# Patient Record
Sex: Male | Born: 1945 | Race: Black or African American | Hispanic: No | Marital: Married | Smoking: Former smoker
Health system: Southern US, Community
[De-identification: ages and names within clinical notes are randomized; demographics above are authoritative.]

## PROBLEM LIST (undated history)

## (undated) DIAGNOSIS — I6529 Occlusion and stenosis of unspecified carotid artery: Secondary | ICD-10-CM

## (undated) DIAGNOSIS — I10 Essential (primary) hypertension: Secondary | ICD-10-CM

## (undated) DIAGNOSIS — E785 Hyperlipidemia, unspecified: Secondary | ICD-10-CM

## (undated) DIAGNOSIS — E119 Type 2 diabetes mellitus without complications: Secondary | ICD-10-CM

## (undated) DIAGNOSIS — G459 Transient cerebral ischemic attack, unspecified: Secondary | ICD-10-CM

## (undated) HISTORY — DX: Occlusion and stenosis of unspecified carotid artery: I65.29

## (undated) HISTORY — DX: Hyperlipidemia, unspecified: E78.5

## (undated) HISTORY — DX: Transient cerebral ischemic attack, unspecified: G45.9

## (undated) HISTORY — DX: Essential (primary) hypertension: I10

## (undated) HISTORY — DX: Type 2 diabetes mellitus without complications: E11.9

## (undated) HISTORY — PX: OTHER SURGICAL HISTORY: SHX169

---

## 2005-11-22 HISTORY — PX: COLONOSCOPY: SHX174

## 2013-05-12 DIAGNOSIS — R209 Unspecified disturbances of skin sensation: Secondary | ICD-10-CM

## 2013-05-14 ENCOUNTER — Other Ambulatory Visit (HOSPITAL_COMMUNITY): Payer: Self-pay | Admitting: Internal Medicine

## 2013-05-14 DIAGNOSIS — G459 Transient cerebral ischemic attack, unspecified: Secondary | ICD-10-CM

## 2013-05-16 ENCOUNTER — Ambulatory Visit (HOSPITAL_COMMUNITY)
Admission: RE | Admit: 2013-05-16 | Discharge: 2013-05-16 | Disposition: A | Discharge status: Home / Self Care | Payer: BC Managed Care – PPO | Source: Ambulatory Visit | Attending: Internal Medicine | Admitting: Internal Medicine

## 2013-05-16 DIAGNOSIS — E119 Type 2 diabetes mellitus without complications: Secondary | ICD-10-CM | POA: Insufficient documentation

## 2013-05-16 DIAGNOSIS — I10 Essential (primary) hypertension: Secondary | ICD-10-CM | POA: Insufficient documentation

## 2013-05-16 DIAGNOSIS — G459 Transient cerebral ischemic attack, unspecified: Secondary | ICD-10-CM | POA: Insufficient documentation

## 2014-02-15 ENCOUNTER — Encounter: Payer: Self-pay | Admitting: *Deleted

## 2014-02-15 DIAGNOSIS — I10 Essential (primary) hypertension: Secondary | ICD-10-CM

## 2014-02-15 DIAGNOSIS — E119 Type 2 diabetes mellitus without complications: Secondary | ICD-10-CM

## 2014-02-15 DIAGNOSIS — E785 Hyperlipidemia, unspecified: Secondary | ICD-10-CM | POA: Insufficient documentation

## 2014-02-18 ENCOUNTER — Ambulatory Visit (INDEPENDENT_AMBULATORY_CARE_PROVIDER_SITE_OTHER): Payer: Medicare Other | Admitting: Internal Medicine

## 2014-02-18 ENCOUNTER — Encounter: Payer: Self-pay | Admitting: Internal Medicine

## 2014-02-18 VITALS — BP 140/90 | HR 83 | Ht 70.0 in | Wt 168.0 lb

## 2014-02-18 DIAGNOSIS — I10 Essential (primary) hypertension: Secondary | ICD-10-CM

## 2014-02-18 DIAGNOSIS — R079 Chest pain, unspecified: Secondary | ICD-10-CM

## 2014-02-18 NOTE — Progress Notes (Signed)
HPI Patient is a 68 yo who presents to cardiology by referral from Hugh[facility]oss[facility] He has a history of DM and HTN  On treatment for about 5 years. [place] [facility] [place] stress test about [place] years prior [place] area.  This past fall he [facility] with numbness/weakness in L arm  Dx'd with TIA  Symptoms resolved  Testing done at [facility] (MRI, TTE)  Kept on same meds.   No symptoms since  Has intermitt L shoulder pain.  Not associated with activitiy.  Some L sided abdomenal pain. Has some SOB with activity.  Not consistent. Does have some palpitations, not that frequent  WIll get weak with these  No syncope  Otherwise stays active.  Allergies  Allergen Reactions  . Amoxicillin     Current Outpatient Prescriptions  Medication Sig Dispense Refill  . amLODipine-benazepril (LOTREL) 10-40 MG per capsule Take 1 capsule by mouth daily.      Marland Kitchen. atorvastatin (LIPITOR) 40 MG tablet Take 40 mg by mouth daily.      . metFORMIN (GLUCOPHAGE) 500 MG tablet Take by mouth 2 (two) times daily with a meal.       No current facility-administered medications for this visit.    No past medical history on file.  No past surgical history on file.  No family history on file.  History   Social History  . Marital Status: Married    Spouse Name: N/A    Number of Children: N/A  . Years of Education: N/A   Occupational History  . Not on file.   Social History Main Topics  . Smoking status: Not on file  . Smokeless tobacco: Not on file  . Alcohol Use: Not on file  . Drug Use: Not on file  . Sexual Activity: Not on file   Other Topics Concern  . Not on file   Social History Narrative  . No narrative on file    Review of Systems:  All systems reviewed.  They are negative to the above problem except as previously stated.  Vital Signs: BP 140/90  Pulse 83  Ht 5\' 10"  (1.778 m)  Wt 168 lb (76.204 kg)  BMI 24.11 kg/m2  SpO2 99%  Physical Exam  HEENT:  Normocephalic,  atraumatic. EOMI, PERRLA.  Neck: JVP is normal.  No bruits.  Lungs: clear to auscultation. No rales no wheezes.  Heart: Regular rate and rhythm. Normal S1, S2. No S3.   No significant murmurs. PMI not displaced.  Abdomen:  Supple, nontender. Normal bowel sounds. No masses. No hepatomegaly.  Extremities:   Good distal pulses throughout. No lower extremity edema.  Musculoskeletal :moving all extremities.  Neuro:   alert and oriented x3.  CN II-XII grossly intact.  EKG:  SR 80  Sl ST depression II, III, AVF, V5, V6. Assessment and Plan:  1.  Shoulder pain.  I am not convinced cardiac   He does give some dyspnea on exertion  With reported CV disease and abnormal EKG would set up for stress myoview  2.  HTN  Adequate control  3.  TIA  Need to get records of testing from [facility] (ECHO, MRI)  Patient gives hx susp for afib  WIll discuss with EP  Re looper. Will need long term follow up of carotid Dz.  Review first.  4.  HL  Keep on statin  Good control

## 2014-02-18 NOTE — Patient Instructions (Addendum)
Your physician recommends that you schedule a follow-up appointment in: in year [facility]    Your physician has requested that you have en exercise stress myoview. For further information please visit [email or web address]. Please follow instruction sheet, as given.    Your physician recommends that you continue on your current medications as directed. Please refer to the Current Medication list given to you today.

## 2014-02-20 ENCOUNTER — Encounter (HOSPITAL_COMMUNITY): Payer: Self-pay

## 2014-02-20 ENCOUNTER — Encounter (HOSPITAL_COMMUNITY)
Admission: RE | Admit: 2014-02-20 | Discharge: 2014-02-20 | Disposition: A | Discharge status: Home / Self Care | Payer: Medicare Other | Source: Ambulatory Visit | Attending: Internal Medicine | Admitting: Internal Medicine

## 2014-02-20 DIAGNOSIS — R079 Chest pain, unspecified: Secondary | ICD-10-CM

## 2014-02-20 MED ORDER — TECHNETIUM TC 99M SESTAMIBI GENERIC - CARDIOLITE
30.0000 | Freq: Once | INTRAVENOUS | Status: AC | PRN
  Administered 2014-02-20: 30 via INTRAVENOUS

## 2014-02-20 MED ORDER — REGADENOSON 0.4 MG/5ML IV SOLN
INTRAVENOUS | Status: AC
  Filled 2014-02-20: qty 5

## 2014-02-20 MED ORDER — TECHNETIUM TC 99M SESTAMIBI - CARDIOLITE
10.0000 | Freq: Once | INTRAVENOUS | Status: AC | PRN
  Administered 2014-02-20: 07:00:00 10 via INTRAVENOUS

## 2014-02-20 MED ORDER — SODIUM CHLORIDE 0.9 % IJ SOLN
INTRAMUSCULAR | Status: AC
  Administered 2014-02-20: 10 mL via INTRAVENOUS
  Filled 2014-02-20: qty 10

## 2014-02-20 NOTE — Progress Notes (Signed)
Stress Lab Nurses Notes - Benjamin Kelly  Benjamin Kelly 02/20/2014 Reason for doing test: Chest Pain and HTN Type of test: Stress Cardiolite Nurse performing test: Benjamin PoissonPhyllis Billingsly, RN Nuclear Medicine Tech: Benjamin Kelly Echo Tech: Not Applicable MD performing test: Branch/K.Lawrence NP Family MD: Benjamin Kelly Test explained and consent signed: yes IV started: 22g jelco, Saline lock flushed, No redness or edema and Saline lock started in radiology Symptoms: SOB Treatment/Intervention: None Reason test stopped: reached target HR After recovery IV was: Discontinued via X-ray tech and No redness or edema Patient to return to Nuc. Med at : 9:30 Patient discharged: Home Patient's Condition upon discharge was: stable Comments: During test peak BP 201/102 & HR 171.  Recovery BP 152/101 & HR 100.  Symptoms resolved in recovery. Benjamin Kelly, Benjamin Kelly

## 2014-02-25 ENCOUNTER — Telehealth: Payer: Self-pay | Admitting: Internal Medicine

## 2014-02-25 NOTE — Telephone Encounter (Signed)
New message ° °Patient would like results of stress test, please call and advise.  °

## 2014-02-25 NOTE — Telephone Encounter (Signed)
Follow up    Pt is calling again for stress test results. OK to leave on vm.

## 2014-02-25 NOTE — Telephone Encounter (Signed)
Notified of stress test results. 

## 2014-06-03 IMAGING — US US CAROTID DUPLEX BILAT
1 series · 13 of 24 positions shown · non-contrast
Comparison: None

CLINICAL DATA: TIA, sudden onset left arm numbness, history
hypertension, diabetes, hypercholesterolemia

BILATERAL CAROTID DUPLEX ULTRASOUND
TECHNIQUE: Gray scale imaging, color Doppler and duplex ultrasound
was performed of bilateral carotid and vertebral arteries in the
neck.

[Series 1: us carotid duplex bilat · 0.06mm/px · 13 of 68 slices shown]
[im 1/68]
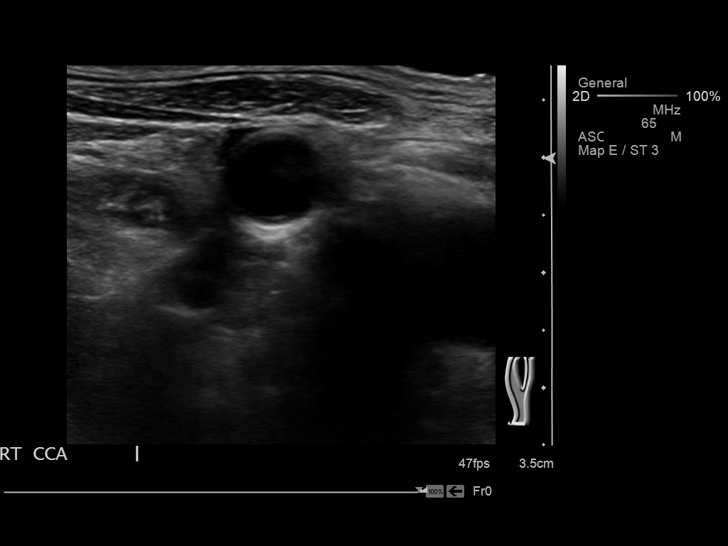
[im 6/68]
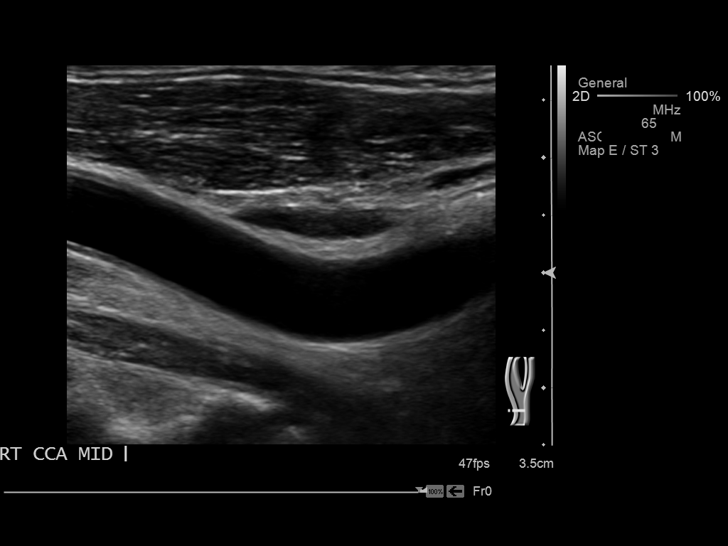
[im 12/68]
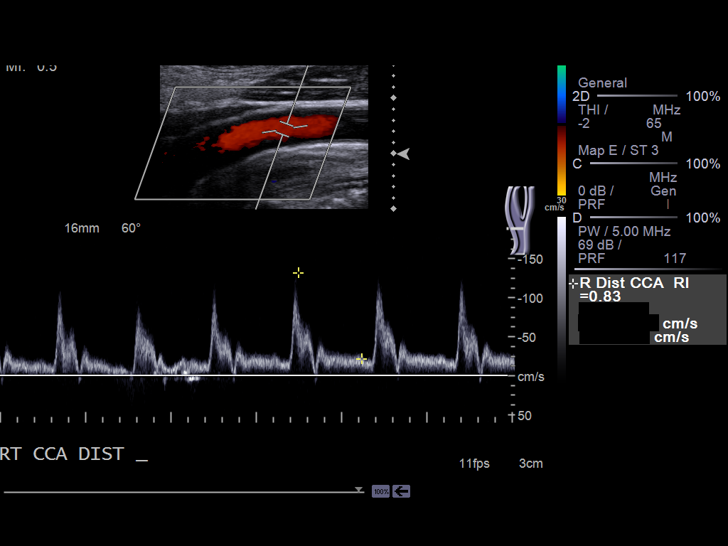
[im 18/68]
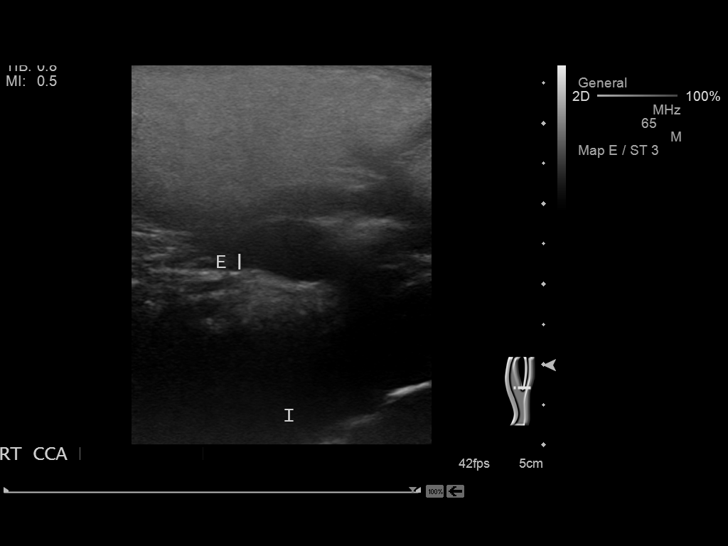
[im 24/68]
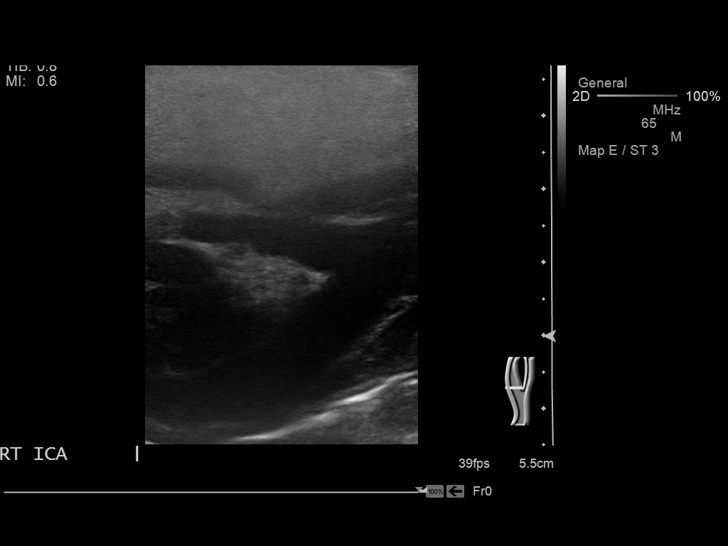
[im 30/68]
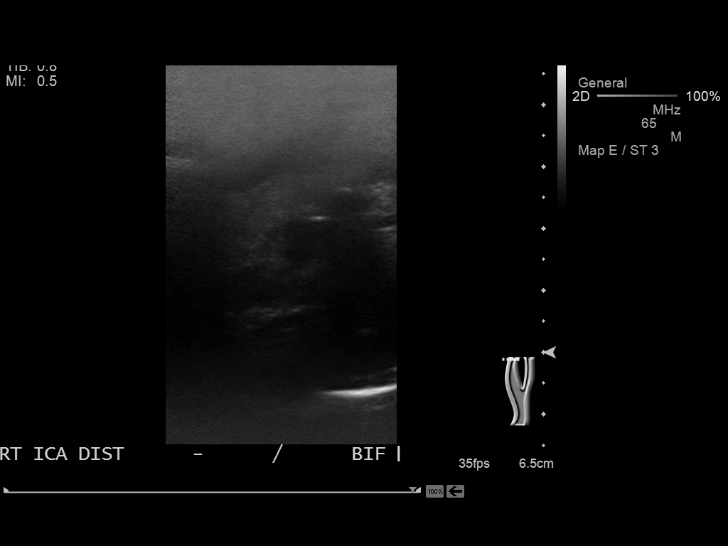
[im 35/68]
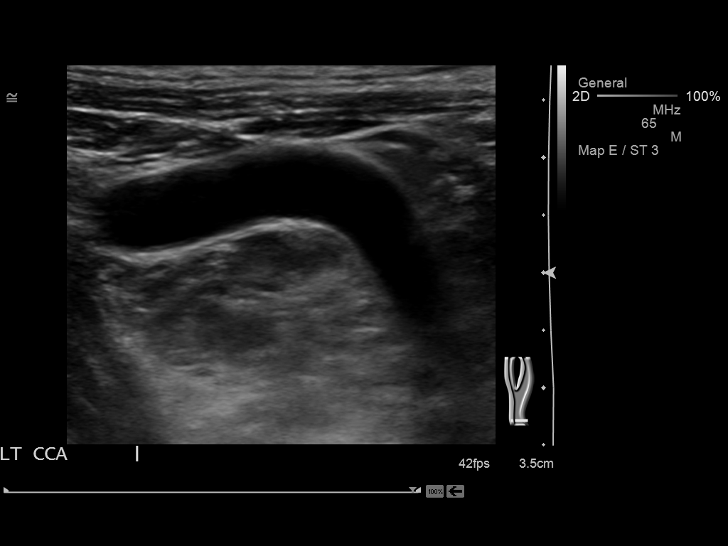
[im 38/68]
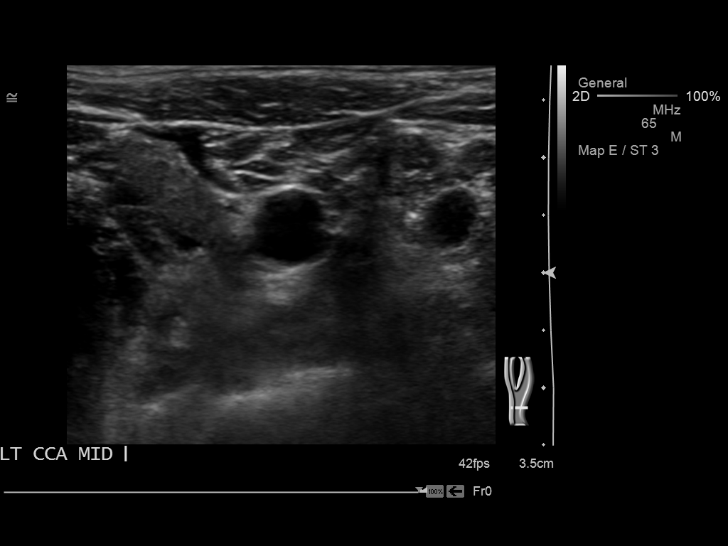
[im 44/68]
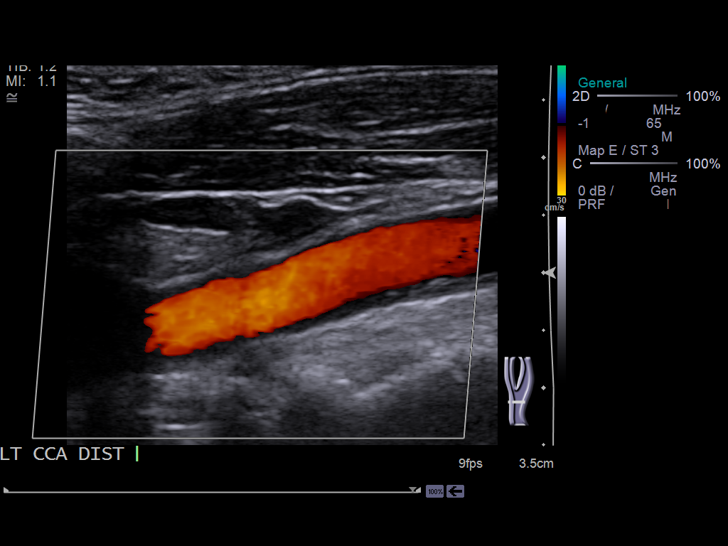
[im 50/68]
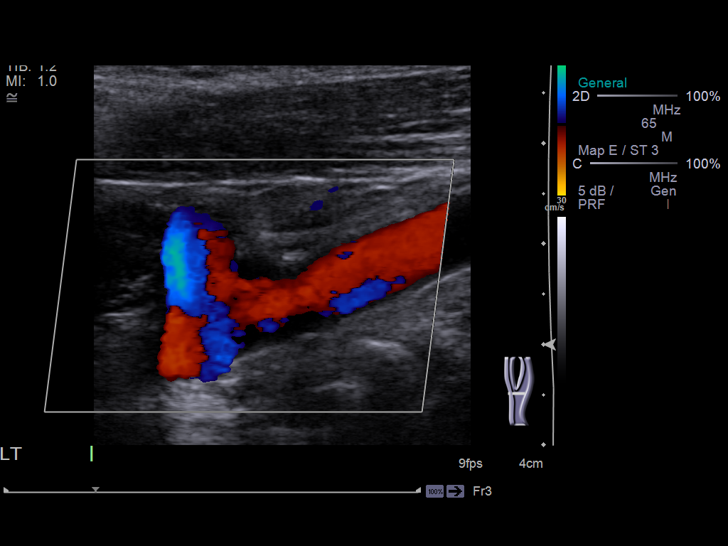
[im 56/68]
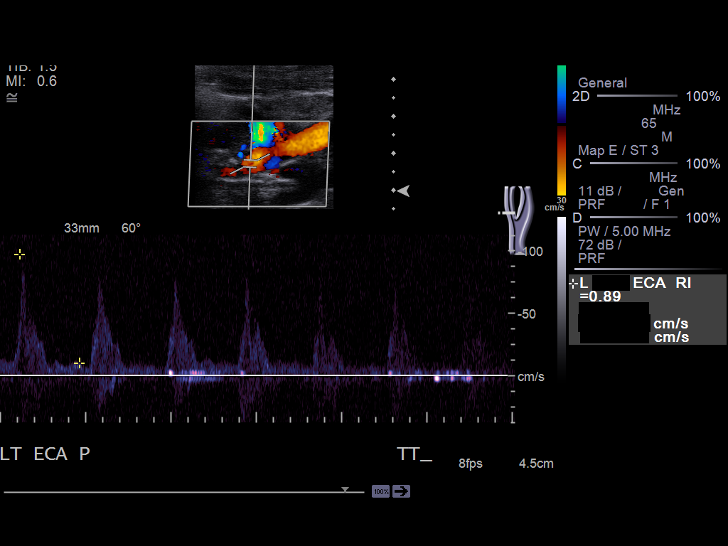
[im 62/68]
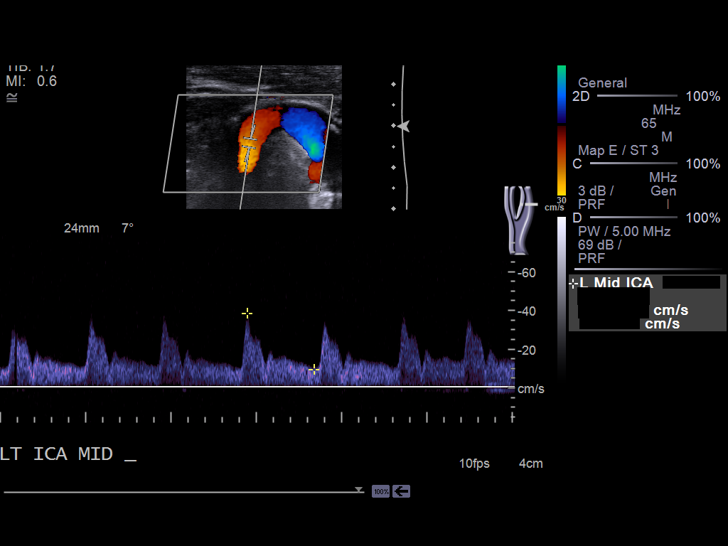
[im 68/68]
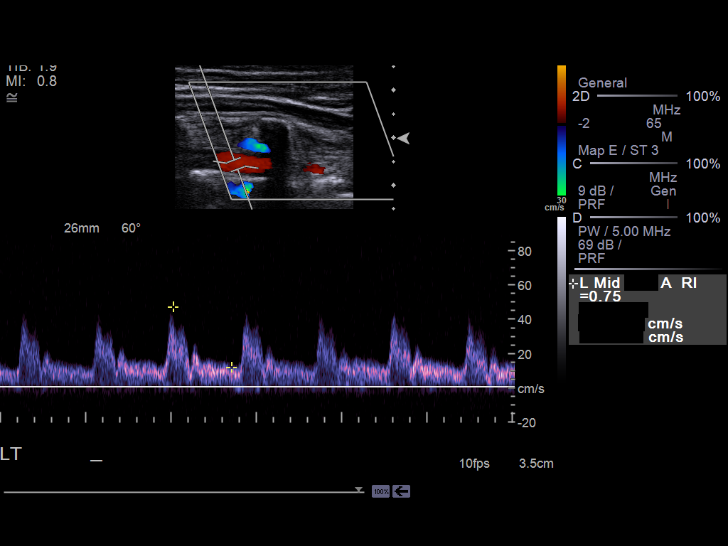

[13 of 24 positions shown; findings below may reference images not displayed]

Criteria:  Quantification of carotid stenosis is based on velocity
parameters that correlate the residual internal carotid diameter
with NASCET-based stenosis levels, using the diameter of the distal
internal carotid lumen as the denominator for stenosis measurement.

The following velocity measurements were obtained:

                 PEAK SYSTOLIC/END DIASTOLIC
RIGHT
ICA:                        51/16cm/sec
CCA:                        140/22cm/sec
SYSTOLIC ICA/CCA RATIO:
DIASTOLIC ICA/CCA RATIO:
ECA:                        60cm/sec

LEFT
ICA:                        64/10cm/sec
CCA:                        125/18cm/sec
SYSTOLIC ICA/CCA RATIO:
DIASTOLIC ICA/CCA RATIO:
ECA:                        97cm/sec
FINDINGS: RIGHT CAROTID ARTERY: Distal right ICA is high and deep,
inadequately visualized.  Tortuous right carotid system.  Mild
scattered intimal thickening.  No significant plaque formation.
Laminar flow by color Doppler imaging.  Wave forms unremarkable.
No high velocity jets.

RIGHT VERTEBRAL ARTERY:  Patent, antegrade

LEFT CAROTID ARTERY: Intimal thickening left carotid system, which
appears tortuous.  Small calcified nonshadowing plaque at distal
left carotid bulb into proximal left ICA.  Turbulent blood flow on
color Doppler imaging.  Spectral broadening left ICA on waveform
analysis.  No high velocity jets.

LEFT VERTEBRAL ARTERY:  Patent, antegrade
IMPRESSION: Mild plaque formation at distal left carotid bulb and proximal left
ICA with velocities corresponding to a less than 50% diameter
stenosis.
No evidence of hemodynamically significant stenosis.
Tortuous carotid systems bilaterally.

## 2014-06-05 ENCOUNTER — Other Ambulatory Visit: Payer: Self-pay | Admitting: *Deleted

## 2014-06-05 DIAGNOSIS — G459 Transient cerebral ischemic attack, unspecified: Secondary | ICD-10-CM

## 2014-06-18 ENCOUNTER — Ambulatory Visit (INDEPENDENT_AMBULATORY_CARE_PROVIDER_SITE_OTHER): Payer: Medicare Other | Admitting: Neurology

## 2014-06-18 ENCOUNTER — Encounter: Payer: Self-pay | Admitting: Neurology

## 2014-06-18 VITALS — BP 137/86 | HR 71 | Ht 68.0 in | Wt 167.0 lb

## 2014-06-18 DIAGNOSIS — G451 Carotid artery syndrome (hemispheric): Secondary | ICD-10-CM

## 2014-06-18 DIAGNOSIS — G458 Other transient cerebral ischemic attacks and related syndromes: Secondary | ICD-10-CM

## 2014-06-18 DIAGNOSIS — E119 Type 2 diabetes mellitus without complications: Secondary | ICD-10-CM

## 2014-06-18 DIAGNOSIS — E785 Hyperlipidemia, unspecified: Secondary | ICD-10-CM

## 2014-06-18 DIAGNOSIS — I10 Essential (primary) hypertension: Secondary | ICD-10-CM

## 2014-06-18 NOTE — Patient Instructions (Addendum)
1. ASA 81mg  daily 2. Continue other home medication include lipitor, ,metformin, amlodipine 3. Monitor weight daily and record.  4. Follow up with PCP for stroke risk factor modification 5. Continue to monitor carotid doppler with PCP. 6. Follow up in 6 months. 7. Will fax a copy to Dr. Margo AyeHall.

## 2014-06-19 NOTE — Progress Notes (Signed)
NEUROLOGY CLINIC NEW PATIENT NOTE  NAME: Benjamin Kelly DOB: 08/07/1946  I saw Benjamin Kelly as a new patient in the neurovascular clinic today regarding  Chief Complaint  Patient presents with  . Cerebrovascular Accident    NP#1  .  HPI: Benjamin Kelly is a 68 y.o. male PMH of HTN, DM, HLD who presents as new patient due to left arm weakness for 30 min one year ago. He is not sure he came her today for but he thinks that he was here for his episode one year ago. Last year in June one day, he woke up with left arm weakness, not able to hold up well, but not dropping things in hand, lasted about 30min and resolved. When he got in ER, symptoms were gone. He denies any numbness, any leg involvement, any headache, vision changes, facial droop or slurry speech. He was told to follow up with PCP and neurology but has not seen neurology since then. He has not had any recurrent symptoms like this.   He has HTN, DM and HLD, following up with his PCP Dr. Margo AyeHall. He is on lipitor, amlodipine-benazepil, and metformin. He stated that he compliant with medication. His next visit with Dr. Margo AyeHall is in October.   He quit smoking for years, no illicit drugs.  Outside reports reviewed: ER visit in 04/2013  Past Medical History  Diagnosis Date  . Diabetes mellitus without complication   . Carotid artery occlusion   . TIA (transient ischemic attack)   . Hypertension   . Hyperlipidemia    No past surgical history on file. Family History  Problem Relation Age of Onset  . Heart disease Mother    Current Outpatient Prescriptions  Medication Sig Dispense Refill  . amLODipine-benazepril (LOTREL) 10-40 MG per capsule Take 1 capsule by mouth daily.      Marland Kitchen. atorvastatin (LIPITOR) 40 MG tablet Take 40 mg by mouth daily.      . metFORMIN (GLUCOPHAGE) 500 MG tablet Take by mouth 2 (two) times daily with a meal.       No current facility-administered medications for this visit.   Allergies  Allergen Reactions  .  Amoxicillin    History   Social History  . Marital Status: Married    Spouse Name: gretchen    Number of Children: 7  . Years of Education: 12th   Occupational History  . retired    Social History Main Topics  . Smoking status: Former Games developermoker  . Smokeless tobacco: Not on file  . Alcohol Use: Yes     Comment: OCC  . Drug Use: No  . Sexual Activity: Yes   Other Topics Concern  . Not on file   Social History Narrative   Patient lives with his wife   Patient right handed   Patient drink coffee sodas    Review of Systems Full 14 system review of systems performed and notable only for those listed, all others are neg:  Constitutional: N/A  Cardiovascular: N/A  Ear/Nose/Throat: N/A  Skin: itching  Eyes: N/A  Respiratory: SOB  Gastroitestinal: N/A  Hematology/Lymphatic: N/A  Endocrine: N/A  Musculoskeletal: N/A  Allergy/Immunology: N/A  Neurological: N/A  Psychiatric: change in appetite   Physical Exam  Filed Vitals:   06/18/14 0928  BP: 137/86  Pulse: 71   General - Well nourished, well developed, in no apparent distress.  Ophthalmologic - Sharp disc margins OU.  Cardiovascular - Regular rate and rhythm with no murmur. Carotid pulses were  2+ without bruits .   Neck - supple, no nuchal rigidity .  Mental Status -  Level of arousal and orientation to time, place, and person were intact. Language including expression, naming, repetition, comprehension, reading, and writing was assessed and found intact. Attention span and concentration were normal. Recent and remote memory were intact. Fund of Knowledge was assessed and was intact.  Cranial Nerves II - XII - II - Vision intact OU. III, IV, VI - Extraocular movements intact. V - Facial sensation intact bilaterally. VII - Facial movement iYMaFreida [facility]NoelleDonell [facility]>including HTN, DM and HLD. He has been following with his PCP for risk factor modification. His episode most likely TIA. He had carotid doppler one year ago indicating left ICA about <50% stenosis, which needs to be monitored. He stated that he just had one with Dr. Hall in May which was not changed. I would recommend to add ASA 81mg  for stroke prevention. Continue his home meds and follow up with Dr. Hall for stroke risk factor modification.  I recommend aggressive blood pressure control with a goal <130/80 mm Hg.  Lipids should be managed intensively, with a goal LDL < 70 mg/dL.  I encouraged the patient to  discuss these important issues with his primary care physician.  I counseled the patient on measures to reduce stroke risk, including the importance of medication compliance, risk factor control, exercise, healthy diet, and avoidance of smoking.  I reviewed stroke warning signs and symptoms and appropriate actions to take if such occurs. He will follow-up with me in 6 months.  - ASA 81mg  daily for stroke prevention - continue home meds - monitor BP and glucose at home - follow up with Dr. Hall for stroke risk factor modification - RTC in 6 months.  Thank you very much for the opportunity to participate in the care of this patient.  Please do not hesitate to call if any questions or concerns arise.  Patient Instructions  1. ASA 81mg  daily 2. Continue other home medication include lipitor, ,metformin, amlodipine 3. Monitor weight daily and record.  4. Follow up with PCP for stroke risk factor modification 5. Continue to monitor carotid doppler with PCP. 6. Follow up in 6 months. 7. Will fax a copy to Dr. Margo Aye.   Marvel Plan, MD PhD [facility] [address], [address] [place], [place] [address] [phone]

## 2014-12-19 ENCOUNTER — Ambulatory Visit: Payer: Medicare Other | Admitting: Neurology

## 2015-03-10 IMAGING — NM NM MYOCAR SINGLE W/SPECT W/WALL MOTION & EF
1 series · 6 of 6 positions shown · non-contrast
Comparison: None.

CLINICAL DATA: 67-year-old male with no known history of coronary
artery disease referred for chest pain.

EXAM:
MYOCARDIAL IMAGING WITH SPECT (REST AND EXERCISE)
GATED LEFT VENTRICULAR WALL MOTION STUDY
LEFT VENTRICULAR EJECTION FRACTION
TECHNIQUE: Standard myocardial SPECT imaging was performed after resting
intravenous injection of 10 mCi 9c-77m sestamibi. Subsequently,
exercise tolerance test was performed by the patient under the
supervision of the Cardiology staff. At peak-stress, 30 mCi 9c-77m
sestamibi was injected intravenously and standard myocardial SPECT
imaging was performed. Quantitative gated imaging was also performed
to evaluate left ventricular wall motion, and estimate left
ventricular ejection fraction.

[Series 1: cs cardiac tc hi dose · 6.41mm/px · 6 of 512 frames shown]
[frame 43/512]
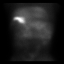
[frame 128/512]
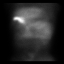
[frame 214/512]
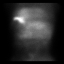
[frame 299/512]
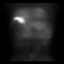
[frame 384/512]
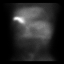
[frame 470/512]
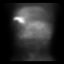

[6 of 6 positions shown; findings below may reference images not displayed]

FINDINGS: Exercise stress

The patient exercised according to the Bruce protocol for 6 min
achieving a work level of 7.0 Mets. The resting heart rate of 74
beats per min increased to a maximal heart rate of 171 beats per min
representing 110 percent of the maximal age predicted heart rate.
The resting blood pressure increased from 141/104 up to 201/102. The
stress test was stopped due to fatigue, the patient did not
experience any chest pain.

Baseline EKG showed normal sinus rhythm with nonspecific ST/ T
changes. Stress EKG interpretation is affected by heavy artifact.
From the available tracings there are no clear ischemic changes or
significant arrhythmias.

Myocardial perfusion imaging

Raw images showed appropriate radiotracer uptake. There was a small
apical defect seen in the resting images, there was no correlating
defect seen in the post-injection images. Wall motion of the apex
was normal This likely is related to apical thinning. There were no
other myocardial perfusion defects.

Gated imaging showed end-diastolic volume 64 mL, end systolic volume
26 mL, left ventricular ejection fraction 60%, t.i.d. 0.8. Normal
wall motion.
IMPRESSION: 1.  Negative exercise MPI for ischemia

2.  Normal left ventricular systolic function and wall motion

3. Duke treadmill score of 6 consistent with low risk for major
cardiac events

4.  Normal functional capacity based on age and gender

## 2016-01-15 ENCOUNTER — Telehealth: Payer: Self-pay

## 2016-01-15 NOTE — Telephone Encounter (Signed)
PT was referred for a colonoscopy in 04/2015 and was sent a letter and he never responded. We just received a referral from Dr. Margo Aye for an urgent appt. I called pt and he is not having any problems. He said he had his first colonoscopy in [place] about 10 years ago. He has the reports. He has an appt in [place] today around 10:00 am and then will call me about coming by to be triaged and show me the reports. He will bring his meds and insurance cards.  He said he received the previous letter but just procrastinated.

## 2016-01-15 NOTE — Telephone Encounter (Signed)
Pt came by the office and the paperwork he had was just his instructions for the procedure and the discharge instructions.  I have scheduled him an OV for 02/16/2016 at 8:00 Am with Tana Coast, PA.  He will sign release for the records when he comes in.

## 2016-02-16 ENCOUNTER — Telehealth: Payer: Self-pay

## 2016-02-16 ENCOUNTER — Encounter: Payer: Self-pay | Admitting: Gastroenterology

## 2016-02-16 ENCOUNTER — Other Ambulatory Visit: Payer: Self-pay

## 2016-02-16 ENCOUNTER — Ambulatory Visit (INDEPENDENT_AMBULATORY_CARE_PROVIDER_SITE_OTHER): Payer: Self-pay | Admitting: Gastroenterology

## 2016-02-16 VITALS — BP 155/99 | HR 79 | Temp 97.1°F | Ht 70.0 in | Wt 164.4 lb

## 2016-02-16 DIAGNOSIS — Z1211 Encounter for screening for malignant neoplasm of colon: Secondary | ICD-10-CM | POA: Insufficient documentation

## 2016-02-16 MED ORDER — NA SULFATE-K SULFATE-MG SULF 17.5-3.13-1.6 GM/177ML PO SOLN: 1.0000 | ORAL | Status: AC

## 2016-02-16 NOTE — Assessment & Plan Note (Signed)
70 year old gentleman who presents for screening colonoscopy. Last one in 2007. He is having no GI symptoms.No FH of colon cancer. Colonoscopy in near future.  I have discussed the risks, alternatives, benefits with regards to but not limited to the risk of reaction to medication, bleeding, infection, perforation and the patient is agreeable to proceed. Written consent to be obtained.

## 2016-02-16 NOTE — Telephone Encounter (Signed)
PA# FOR TCS- [id number]

## 2016-02-16 NOTE — Progress Notes (Signed)
Primary Care Physician:  Dwana MelenaZack Hall, MD  Primary Gastroenterologist:  Roetta SessionsMichael Rourk, MD   Chief Complaint  Patient presents with  . set up TCS    HPI:  Benjamin Kelly is a 70 y.o. male here to schedule a screening colonoscopy. [place]. 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 He was advised to come back in 7-10 years. I do not have the official colonoscopy report but had his discharge instructions with pictures/descriptions. There was no pathology. Patient states that he does well. No constipation, diarrhea, melena, rectal bleeding, abdominal pain, weight loss, heartburn, dysphagia, vomiting.    Current Outpatient Prescriptions  Medication Sig Dispense Refill  . amLODipine-benazepril (LOTREL) 10-40 MG per capsule Take 1 capsule by mouth daily.    Marland Kitchen. aspirin 81 MG tablet Take 81 mg by mouth daily.    Marland Kitchen. atorvastatin (LIPITOR) 40 MG tablet Take 40 mg by mouth daily.    . metFORMIN (GLUCOPHAGE) 500 MG tablet Take by mouth 2 (two) times daily with a meal.     No current facility-administered medications for this visit.    Allergies as of 02/16/2016 - Review Complete 02/16/2016  Allergen Reaction Noted  . Amoxicillin  02/18/2014    Past Medical History  Diagnosis Date  . Diabetes mellitus without complication (HCC)   . Carotid artery occlusion   . TIA (transient ischemic attack)   . Hypertension   . Hyperlipidemia     Past Surgical History  Procedure Laterality Date  . None      Family History  Problem Relation Age of Onset  . Heart disease Mother   . Colon cancer Neg Hx     Social History   Social History  . Marital Status: Married    Spouse Name: gretchen  . Number of Children: 7  . Years of Education: 12th   Occupational History  . 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 Smoking status: Former Games developermoker  . Smokeless tobacco: Not on file  . Alcohol Use: 0.0 oz/week    0  Standard drinks or equivalent per week     Comment: OCC, once every 6-12 months  . Drug Use: No  . Sexual Activity: Yes   Other Topics Concern  . Not on file   Social History Narrative   Patient lives with his wife   Patient right handed   Patient drink coffee sodas      ROS:  General: Negative for anorexia, weight loss, fever, chills, fatigue, weakness. Eyes: Negative for vision changes.  ENT: Negative for hoarseness, difficulty swallowing , nasal congestion. CV: Negative for chest pain, angina, palpitations, dyspnea on exertion, peripheral edema.  Respiratory: Negative for dyspnea at rest, dyspnea on exertion, cough, sputum, wheezing.  GI: See history of present illness. GU:  Negative for dysuria, hematuria, urinary incontinence, urinary frequency, nocturnal urination.  MS: Negative for joint pain, low back pain.  Derm: Negative for rash or itching.  Neuro: Negative for weakness, abnormal sensation, seizure, frequent headaches, memory loss, confusion.  Psych: Negative for anxiety, depression, suicidal ideation, hallucinations.  Endo: Negative for unusual weight change.  Heme: Negative for bruising or bleeding. Allergy: Negative for rash or hives.    Physical Examination:  BP 155/99 mmHg  Pulse 79  Temp(Src) 97.1 F (36.2 C)  Ht 5\' 10"  (1.778 m)  Wt 164 lb 6.4 oz (74.571 kg)  BMI 23.59 kg/m2  General: Well-nourished, well-developed in no acute distress.  Head: Normocephalic, atraumatic.   Eyes: Conjunctiva pink, no icterus. Mouth: Oropharyngeal mucosa moist and pink , no lesions erythema or exudate. Neck: Supple without thyromegaly, masses, or lymphadenopathy.  Lungs: Clear to auscultation bilaterally.  Heart: Regular rate and rhythm, no murmurs rubs or gallops.  Abdomen: Bowel sounds are normal, nontender, nondistended, no hepatosplenomegaly or masses, no abdominal bruits or    hernia , no rebound or guarding.   Rectal: Deferred Extremities: No lower extremity  edema. No clubbing or deformities.  Neuro: Alert and oriented x 4 , grossly normal neurologically.  Skin: Warm and dry, no rash or jaundice.   Psych: Alert and cooperative, normal mood and affect.  Labs: Labs from 01/07/2016 Sodium 140, potassium 4.8, glucose 95, BUN 10, creatinine 1.03, total bilirubin 0.4, alkaline phosphatase 94, AST 18, ALT 10, albumin 3.9, calcium 10.2, white blood cell count 5100, hemoglobin 13.7, platelets 308,000, hemoglobin A1c 6.2  Imaging Studies: No results found.

## 2016-02-16 NOTE — Patient Instructions (Signed)
Colonoscopy with Dr. Rourk. See separate instructions. 

## 2016-02-16 NOTE — Progress Notes (Signed)
CC'ED TO PCP 

## 2016-03-04 ENCOUNTER — Encounter (HOSPITAL_COMMUNITY): Payer: Self-pay | Admitting: *Deleted

## 2016-03-04 ENCOUNTER — Ambulatory Visit (HOSPITAL_COMMUNITY)
Admission: RE | Admit: 2016-03-04 | Discharge: 2016-03-04 | Disposition: A | Discharge status: Home / Self Care | Payer: Commercial Managed Care - HMO | Source: Ambulatory Visit | Attending: Internal Medicine | Admitting: Internal Medicine

## 2016-03-04 ENCOUNTER — Encounter (HOSPITAL_COMMUNITY)
Admission: RE | Disposition: A | Discharge status: Home / Self Care | Payer: Self-pay | Source: Ambulatory Visit | Attending: Internal Medicine

## 2016-03-04 DIAGNOSIS — Z7982 Long term (current) use of aspirin: Secondary | ICD-10-CM | POA: Diagnosis not present

## 2016-03-04 DIAGNOSIS — Z1211 Encounter for screening for malignant neoplasm of colon: Secondary | ICD-10-CM

## 2016-03-04 DIAGNOSIS — K5730 Diverticulosis of large intestine without perforation or abscess without bleeding: Secondary | ICD-10-CM | POA: Diagnosis not present

## 2016-03-04 DIAGNOSIS — Z87891 Personal history of nicotine dependence: Secondary | ICD-10-CM | POA: Insufficient documentation

## 2016-03-04 DIAGNOSIS — Z8673 Personal history of transient ischemic attack (TIA), and cerebral infarction without residual deficits: Secondary | ICD-10-CM | POA: Insufficient documentation

## 2016-03-04 DIAGNOSIS — E785 Hyperlipidemia, unspecified: Secondary | ICD-10-CM | POA: Diagnosis not present

## 2016-03-04 DIAGNOSIS — Z7984 Long term (current) use of oral hypoglycemic drugs: Secondary | ICD-10-CM | POA: Diagnosis not present

## 2016-03-04 DIAGNOSIS — Z79899 Other long term (current) drug therapy: Secondary | ICD-10-CM | POA: Insufficient documentation

## 2016-03-04 DIAGNOSIS — E119 Type 2 diabetes mellitus without complications: Secondary | ICD-10-CM | POA: Diagnosis not present

## 2016-03-04 DIAGNOSIS — I10 Essential (primary) hypertension: Secondary | ICD-10-CM | POA: Diagnosis not present

## 2016-03-04 HISTORY — PX: COLONOSCOPY: SHX5424

## 2016-03-04 LAB — GLUCOSE, CAPILLARY: Glucose-Capillary: 91 mg/dL (ref 65–99)

## 2016-03-04 SURGERY — COLONOSCOPY
Anesthesia: Moderate Sedation

## 2016-03-04 MED ORDER — STERILE WATER FOR IRRIGATION IR SOLN
Status: DC | PRN
  Administered 2016-03-04: 2.5 mL

## 2016-03-04 MED ORDER — MEPERIDINE HCL 100 MG/ML IJ SOLN
INTRAMUSCULAR | Status: DC | PRN
  Administered 2016-03-04 (×2): 25 mg via INTRAVENOUS
  Administered 2016-03-04: 50 mg via INTRAVENOUS

## 2016-03-04 MED ORDER — MIDAZOLAM HCL 5 MG/5ML IJ SOLN
INTRAMUSCULAR | Status: DC | PRN
  Administered 2016-03-04 (×2): 1 mg via INTRAVENOUS
  Administered 2016-03-04 (×2): 2 mg via INTRAVENOUS

## 2016-03-04 MED ORDER — MEPERIDINE HCL 100 MG/ML IJ SOLN
INTRAMUSCULAR | Status: AC
  Filled 2016-03-04: qty 2

## 2016-03-04 MED ORDER — SODIUM CHLORIDE 0.9 % IV SOLN
INTRAVENOUS | Status: DC
  Administered 2016-03-04: 1000 mL via INTRAVENOUS

## 2016-03-04 MED ORDER — MIDAZOLAM HCL 5 MG/5ML IJ SOLN
INTRAMUSCULAR | Status: AC
  Filled 2016-03-04: qty 10

## 2016-03-04 MED ORDER — ONDANSETRON HCL 4 MG/2ML IJ SOLN
INTRAMUSCULAR | Status: AC
  Filled 2016-03-04: qty 2

## 2016-03-04 MED ORDER — ONDANSETRON HCL 4 MG/2ML IJ SOLN
INTRAMUSCULAR | Status: DC | PRN
  Administered 2016-03-04: 4 mg via INTRAVENOUS

## 2016-03-04 NOTE — Interval H&P Note (Signed)
History and Physical Interval Note:  03/04/2016 8:22 AM  Benjamin Kelly  has presented today for surgery, with the diagnosis of SCREENING  The various methods of treatment have been discussed with the patient and family. After consideration of risks, benefits and other options for treatment, the patient has consented to  Procedure(s) with comments: COLONOSCOPY (N/A) - 815 as a surgical intervention .  The patient's history has been reviewed, patient examined, no change in status, stable for surgery.  I have reviewed the patient's chart and labs.  Questions were answered to the patient's satisfaction.     Benjamin Kelly   No change. Screening colonoscopy per plan.  The risks, benefits, limitations, alternatives and imponderables have been reviewed with the patient. Questions have been answered. All parties are agreeable.

## 2016-03-04 NOTE — Op Note (Signed)
[facility] Patient Name: Benjamin Kelly Procedure Date: 03/04/2016 7:28 AM MRN: [id number] Date of Birth: May 20, 1946 Attending MD: Gennette Pac , MD CSN: [id number] Age: 70 Admit Type: Inpatient Procedure:                Colonoscopy Indications:              Screening for colorectal malignant neoplasm Providers:                Gennette Pac, MD, Nena Polio, RN, Birder Robson, Technician Referring MD:             Catalina Pizza M.D. Medicines:                Midazolam 6 mg IV, Meperidine 100 mg IV,                            Ondansetron 4 mg IV Complications:            No immediate complications. Estimated Blood Loss:     Estimated blood loss: none. Procedure:                Pre-Anesthesia Assessment:                           - Prior to the procedure, a History and Physical                            was performed, and patient medications and                            allergies were reviewed. The patient's tolerance of                            previous anesthesia was also reviewed. The risks                            and benefits of the procedure and the sedation                            options and risks were discussed with the patient.                            All questions were answered, and informed consent                            was obtained. Prior Anticoagulants: The patient has                            taken no previous anticoagulant or antiplatelet                            agents. ASA Grade Assessment: II - A patient with  mild systemic disease. After reviewing the risks                            and benefits, the patient was deemed in                            satisfactory condition to undergo the procedure.                           After obtaining informed consent, the colonoscope                            was passed under direct vision. Throughout the   procedure, the patient's blood pressure, pulse, and                            oxygen saturations were monitored continuously. The                            EC-[id number] ([id number]) scope was introduced through                            the anus and advanced to the the cecum, identified                            by appendiceal orifice and ileocecal valve. The                            colonoscopy was somewhat difficult due to a                            tortuous colon. Successful completion of the                            procedure was aided by applying abdominal pressure.                            The patient tolerated the procedure well. The                            quality of the bowel preparation was adequate. The                            ileocecal valve, appendiceal orifice, and rectum                            were photographed. Scope In: 8:35:31 AM Scope Out: 8:53:24 AM Scope Withdrawal Time: 0 hours 9 minutes 29 seconds  Total Procedure Duration: 0 hours 17 minutes 53 seconds  Findings:      few small-mouthed diverticula were found in the entire colon.      The exam was otherwise without abnormality on direct and retroflexion       views.      The perianal and digital rectal examinations were normal. Impression:               -  Diverticulosis in the entire examined colon -                           - The examination was otherwise normal on direct                            and retroflexion views.                           - No specimens collected. Moderate Sedation:      Moderate (conscious) sedation was administered by the endoscopy nurse       and supervised by the endoscopist. The following parameters were       monitored: oxygen saturation, heart rate, blood pressure, respiratory       rate, EKG, adequacy of pulmonary ventilation, and response to care.       Total physician intraservice time was 28 minutes. Recommendation:           - Patient has a contact number  available for                            emergencies. The signs and symptoms of potential                            delayed complications were discussed with the                            patient. Return to normal activities tomorrow.                            Written discharge instructions were provided to the                            patient.                           - Advance diet as tolerated.                           - Continue present medications.                           - Await pathology results.                           - Repeat colonoscopy in 10 years for screening                            purposes.                           - Return to GI office PRN. Procedure Code(s):        --- Professional ---                           [phone], Colonoscopy, flexible; diagnostic, including  collection of specimen(s) by brushing or washing,                            when performed (separate procedure)                           99152, Moderate sedation services provided by the                            same physician or other qualified health care                            professional performing the diagnostic or                            therapeutic service that the sedation supports,                            requiring the presence of an independent trained                            observer to assist in the monitoring of the                            patient's level of consciousness and physiological                            status; initial 15 minutes of intraservice time,                            patient age 18 years or older                           [phone], Moderate sedation services; each additional                            15 minutes intraservice time Diagnosis Code(s):        --- Professional ---                           Z12.11, Encounter for screening for malignant                            neoplasm of colon                            K57.30, Diverticulosis of large intestine without                            perforation or abscess without bleeding CPT copyright 2016 [employer]. All rights reserved. The codes documented in this report are preliminary and upon coder review may  be revised to meet current compliance requirements. Gerrit Friends. Charl Wellen, MD Gennette Pac, MD 03/04/2016 9:12:30 AM This report has been signed electronically. Number of Addenda: 0

## 2016-03-04 NOTE — H&P (View-Only) (Signed)
Primary Care Physician:  Dwana MelenaZack Hall, MD  Primary Gastroenterologist:  Roetta SessionsMichael Rourk, MD   Chief Complaint  Patient presents with  . set up TCS    HPI:  Benjamin Kelly is a 70 y.o. male here to schedule a screening colonoscopy. [place] 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 He was advised to come back in 7-10 years. I do not have the official colonoscopy report but had his discharge instructions with pictures/descriptions. There was no pathology. Patient states that he does well. No constipation, diarrhea, melena, rectal bleeding, abdominal pain, weight loss, heartburn, dysphagia, vomiting.    Current Outpatient Prescriptions  Medication Sig Dispense Refill  . amLODipine-benazepril (LOTREL) 10-40 MG per capsule Take 1 capsule by mouth daily.    Marland Kitchen. aspirin 81 MG tablet Take 81 mg by mouth daily.    Marland Kitchen. atorvastatin (LIPITOR) 40 MG tablet Take 40 mg by mouth daily.    . metFORMIN (GLUCOPHAGE) 500 MG tablet Take by mouth 2 (two) times daily with a meal.     No current facility-administered medications for this visit.    Allergies as of 02/16/2016 - Review Complete 02/16/2016  Allergen Reaction Noted  . Amoxicillin  02/18/2014    Past Medical History  Diagnosis Date  . Diabetes mellitus without complication (HCC)   . Carotid artery occlusion   . TIA (transient ischemic attack)   . Hypertension   . Hyperlipidemia     Past Surgical History  Procedure Laterality Date  . None      Family History  Problem Relation Age of Onset  . Heart disease Mother   . Colon cancer Neg Hx     Social History   Social History  . Marital Status: Married    Spouse Name: gretchen  . Number of Children: 7  . Years of Education: 12th   Occupational History  . 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 Smoking status: Former Games developermoker  . Smokeless tobacco: Not on file  . Alcohol Use: 0.0 oz/week    0  Standard drinks or equivalent per week     Comment: OCC, once every 6-12 months  . Drug Use: No  . Sexual Activity: Yes   Other Topics Concern  . Not on file   Social History Narrative   Patient lives with his wife   Patient right handed   Patient drink coffee sodas      ROS:  General: Negative for anorexia, weight loss, fever, chills, fatigue, weakness. Eyes: Negative for vision changes.  ENT: Negative for hoarseness, difficulty swallowing , nasal congestion. CV: Negative for chest pain, angina, palpitations, dyspnea on exertion, peripheral edema.  Respiratory: Negative for dyspnea at rest, dyspnea on exertion, cough, sputum, wheezing.  GI: See history of present illness. GU:  Negative for dysuria, hematuria, urinary incontinence, urinary frequency, nocturnal urination.  MS: Negative for joint pain, low back pain.  Derm: Negative for rash or itching.  Neuro: Negative for weakness, abnormal sensation, seizure, frequent headaches, memory loss, confusion.  Psych: Negative for anxiety, depression, suicidal ideation, hallucinations.  Endo: Negative for unusual weight change.  Heme: Negative for bruising or bleeding. Allergy: Negative for rash or hives.    Physical Examination:  BP 155/99 mmHg  Pulse 79  Temp(Src) 97.1 F (36.2 C)  Ht 5\' 10"  (1.778 m)  Wt 164 lb 6.4 oz (74.571 kg)  BMI 23.59 kg/m2  General: Well-nourished, well-developed in no acute distress.  Head: Normocephalic, atraumatic.   Eyes: Conjunctiva pink, no icterus. Mouth: Oropharyngeal mucosa moist and pink , no lesions erythema or exudate. Neck: Supple without thyromegaly, masses, or lymphadenopathy.  Lungs: Clear to auscultation bilaterally.  Heart: Regular rate and rhythm, no murmurs rubs or gallops.  Abdomen: Bowel sounds are normal, nontender, nondistended, no hepatosplenomegaly or masses, no abdominal bruits or    hernia , no rebound or guarding.   Rectal: Deferred Extremities: No lower extremity  edema. No clubbing or deformities.  Neuro: Alert and oriented x 4 , grossly normal neurologically.  Skin: Warm and dry, no rash or jaundice.   Psych: Alert and cooperative, normal mood and affect.  Labs: Labs from 01/07/2016 Sodium 140, potassium 4.8, glucose 95, BUN 10, creatinine 1.03, total bilirubin 0.4, alkaline phosphatase 94, AST 18, ALT 10, albumin 3.9, calcium 10.2, white blood cell count 5100, hemoglobin 13.7, platelets 308,000, hemoglobin A1c 6.2  Imaging Studies: No results found.

## 2016-03-04 NOTE — Discharge Instructions (Signed)
Colonoscopy Discharge Instructions  Read the instructions outlined below and refer to this sheet in the next few weeks. These discharge instructions provide you with general information on caring for yourself after you leave the hospital. Your doctor may also give you specific instructions. While your treatment has been planned according to the most current medical practices available, unavoidable complications occasionally occur. If you have any problems or questions after discharge, call Dr. Jena Gauss at [phone]. ACTIVITY  You may resume your regular activity, but move at a slower pace for the next 24 hours.   Take frequent rest periods for the next 24 hours.   Walking will help get rid of the air and reduce the bloated feeling in your belly (abdomen).   No driving for 24 hours (because of the medicine (anesthesia) used during the test).    Do not sign any important legal documents or operate any machinery for 24 hours (because of the anesthesia used during the test).  NUTRITION  Drink plenty of fluids.   You may resume your normal diet as instructed by your doctor.   Begin with a light meal and progress to your normal diet. Heavy or fried foods are harder to digest and may make you feel sick to your stomach (nauseated).   Avoid alcoholic beverages for 24 hours or as instructed.  MEDICATIONS  You may resume your normal medications unless your doctor tells you otherwise.  WHAT YOU CAN EXPECT TODAY  Some feelings of bloating in the abdomen.   Passage of more gas than usual.   Spotting of blood in your stool or on the toilet paper.  IF YOU HAD POLYPS REMOVED DURING THE COLONOSCOPY:  No aspirin products for 7 days or as instructed.   No alcohol for 7 days or as instructed.   Eat a soft diet for the next 24 hours.  FINDING OUT THE RESULTS OF YOUR TEST Not all test results are available during your visit. If your test results are not back during the visit, make an appointment  with your caregiver to find out the results. Do not assume everything is normal if you have not heard from your caregiver or the medical facility. It is important for you to follow up on all of your test results.  SEEK IMMEDIATE MEDICAL ATTENTION IF:  You have more than a spotting of blood in your stool.   Your belly is swollen (abdominal distention).   You are nauseated or vomiting.   You have a temperature over 101.   You have abdominal pain or discomfort that is severe or gets worse throughout the day.    Diverticulosis information provided  1 more screening colonoscopy in 10 years    Diverticulosis Diverticulosis is the condition that develops when small pouches (diverticula) form in the wall of your colon. Your colon, or large intestine, is where water is absorbed and stool is formed. The pouches form when the inside layer of your colon pushes through weak spots in the outer layers of your colon. CAUSES  No one knows exactly what causes diverticulosis. RISK FACTORS  Being older than 50. Your risk for this condition increases with age. Diverticulosis is rare in people younger than 40 years. By age 48, almost everyone has it.  Eating a low-fiber diet.  Being frequently constipated.  Being overweight.  Not getting enough exercise.  Smoking.  Taking over-the-counter pain medicines, like aspirin and ibuprofen. SYMPTOMS  Most people with diverticulosis do not have symptoms. DIAGNOSIS  Because diverticulosis often  has no symptoms, health care providers often discover the condition during an exam for other colon problems. In many cases, a health care provider will diagnose diverticulosis while using a flexible scope to examine the colon (colonoscopy). TREATMENT  If you have never developed an infection related to diverticulosis, you may not need treatment. If you have had an infection before, treatment may include:  Eating more fruits, vegetables, and grains.  Taking a  fiber supplement.  Taking a live bacteria supplement (probiotic).  Taking medicine to relax your colon. HOME CARE INSTRUCTIONS   Drink at least 6-8 glasses of water each day to prevent constipation.  Try not to strain when you have a bowel movement.  Keep all follow-up appointments. If you have had an infection before:  Increase the fiber in your diet as directed by your health care provider or dietitian.  Take a dietary fiber supplement if your health care provider approves.  Only take medicines as directed by your health care provider. SEEK MEDICAL CARE IF:   You have abdominal pain.  You have bloating.  You have cramps.  You have not gone to the bathroom in 3 days. SEEK IMMEDIATE MEDICAL CARE IF:   Your pain gets worse.  Yourbloating becomes very bad.  You have a fever or chills, and your symptoms suddenly get worse.  You begin vomiting.  You have bowel movements that are bloody or black. MAKE SURE YOU:  Understand these instructions.  Will watch your condition.  Will get help right away if you are not doing well or get worse.   This information is not intended to replace advice given to you by your health care provider. Make sure you discuss any questions you have with your health care provider.   Document Released: 08/05/2004 Document Revised: 11/13/2013 Document Reviewed: 10/03/2013 Elsevier Interactive Patient Education [employer]2016 [employer]

## 2016-03-08 ENCOUNTER — Encounter (HOSPITAL_COMMUNITY): Payer: Self-pay | Admitting: Internal Medicine
